# Patient Record
Sex: Male | Born: 1995 | Race: White | Hispanic: Yes | Marital: Married | Smoking: Current some day smoker
Health system: Southern US, Community
[De-identification: ages and names within clinical notes are randomized; demographics above are authoritative.]

---

## 2014-05-15 ENCOUNTER — Other Ambulatory Visit: Payer: Self-pay | Admitting: Occupational Medicine

## 2014-05-15 ENCOUNTER — Ambulatory Visit
Admission: RE | Admit: 2014-05-15 | Discharge: 2014-05-15 | Disposition: A | Discharge status: Home / Self Care | Payer: No Typology Code available for payment source | Source: Ambulatory Visit | Attending: Occupational Medicine | Admitting: Occupational Medicine

## 2014-05-15 DIAGNOSIS — Z021 Encounter for pre-employment examination: Secondary | ICD-10-CM

## 2015-12-27 IMAGING — CR DG CHEST 1V
1 series · 1 of 1 positions shown · non-contrast
Comparison: None.

CLINICAL DATA: Pre-employment physical exam

EXAM:
CHEST - 1 VIEW

[view not recorded]
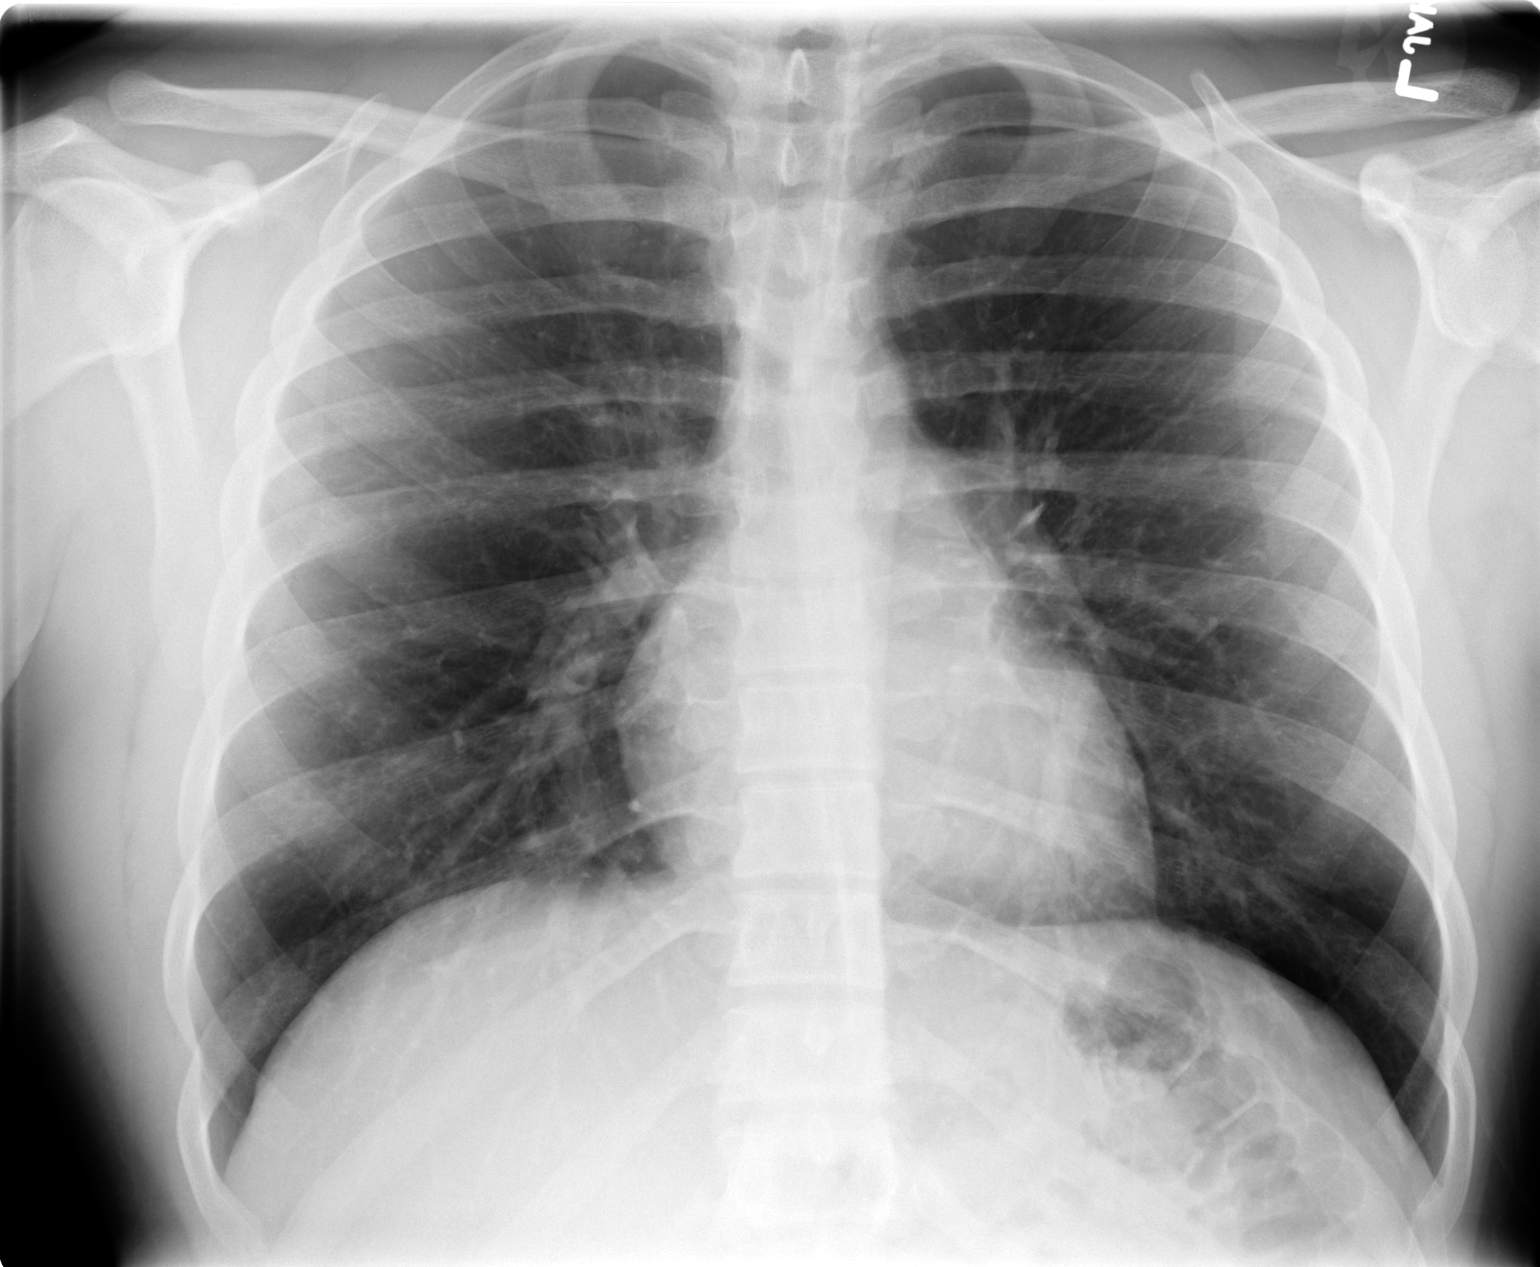

[1 of 1 positions shown; findings below may reference images not displayed]

FINDINGS: No active infiltrate or effusion is seen. Mediastinal and hilar
contours are unremarkable. The heart is within normal limits in
size. No bony abnormality is seen.
IMPRESSION: No active disease.

## 2016-10-17 ENCOUNTER — Encounter (HOSPITAL_BASED_OUTPATIENT_CLINIC_OR_DEPARTMENT_OTHER): Payer: Self-pay | Admitting: Emergency Medicine

## 2016-10-17 ENCOUNTER — Emergency Department (HOSPITAL_BASED_OUTPATIENT_CLINIC_OR_DEPARTMENT_OTHER)
Admission: EM | Admit: 2016-10-17 | Discharge: 2016-10-17 | Disposition: A | Discharge status: Home / Self Care | Payer: 59 | Attending: Emergency Medicine | Admitting: Emergency Medicine

## 2016-10-17 DIAGNOSIS — J029 Acute pharyngitis, unspecified: Secondary | ICD-10-CM | POA: Insufficient documentation

## 2016-10-17 LAB — RAPID STREP SCREEN (MED CTR MEBANE ONLY): STREPTOCOCCUS, GROUP A SCREEN (DIRECT): NEGATIVE

## 2016-10-17 MED ORDER — AMOXICILLIN 500 MG PO CAPS: 1000.0000 mg | ORAL_CAPSULE | Freq: Every day | ORAL | 0 refills | Status: AC

## 2016-10-17 MED ORDER — MAGIC MOUTHWASH W/LIDOCAINE: 5.0000 mL | Freq: Four times a day (QID) | ORAL | 0 refills | Status: DC | PRN

## 2016-10-17 NOTE — ED Provider Notes (Signed)
[facility]   CSN: [id number] Arrival date & time: 10/17/16  1901  By signing my name below, I, Mitchell Boyer, attest that this documentation has been prepared under the direction and in the presence of Beverley Sherrard PA-C. Electronically Signed: Thelma Boyer, Scribe. 10/17/16. 7:50 PM.  History   Chief Complaint Chief Complaint  Patient presents with  . Sore Throat   The history is provided by the patient. No language interpreter was used.   HPI Comments: Mitchell Boyer is a 21 y.o. male who presents to the Emergency Department complaining of constant, increasingly worsening left-sided sore throat since 3 days. He has associated pain with swallowing, left-sided neck pain, dry cough, rhinorrhea, mild changes in appetite, and diarrhea.  Denies sick contacts. Reports feeling chills. He has taken Dayquil with some relief. He denies SOB, drooling, ear pain, CP, nausea, vomiting.  History reviewed. No pertinent past medical history.  There are no active problems to display for this patient.   History reviewed. No pertinent surgical history.     Home Medications    Prior to Admission medications   Medication Sig Start Date End Date Taking? Authorizing Provider  amoxicillin (AMOXIL) 500 MG capsule Take 2 capsules (1,000 mg total) by mouth daily. 10/17/16 10/27/16  Dietrich Pates, PA-C  magic mouthwash w/lidocaine SOLN Take 5 mLs by mouth 4 (four) times daily as needed for mouth pain. 10/17/16   Dietrich Pates, PA-C    Family History No family history on file.  Social History Social History  Substance Use Topics  . Smoking status: Never Smoker  . Smokeless tobacco: Never Used  . Alcohol use No     Allergies   Patient has no known allergies.   Review of Systems Review of Systems  Constitutional: Negative for appetite change (mild), chills and fever.  HENT: Positive for rhinorrhea and sore throat. Negative for drooling, ear pain, facial swelling and trouble  swallowing.   Respiratory: Positive for cough (dry). Negative for shortness of breath.   Cardiovascular: Negative for chest pain.  Gastrointestinal: Positive for diarrhea. Negative for abdominal pain, nausea and vomiting.  Musculoskeletal: Positive for neck pain (left-sided).  Neurological: Negative for facial asymmetry.     Physical Exam Updated Vital Signs BP 129/71 (BP Location: Right Arm)   Pulse 73   Temp 98.3 F (36.8 C) (Oral)   Resp 16   Ht 5\' 10"  (1.778 m)   Wt 95.3 kg (210 lb)   SpO2 100%   BMI 30.13 kg/m   Physical Exam  Constitutional: He appears well-developed and well-nourished. No distress.  HENT:  Head: Normocephalic and atraumatic.  Right Ear: Tympanic membrane normal.  Left Ear: Tympanic membrane normal.  Nose: Nose normal.  Mouth/Throat: Uvula is midline and mucous membranes are normal. Posterior oropharyngeal erythema present. No tonsillar abscesses. Tonsils are 2+ on the left. Tonsillar exudate.  Eyes: Conjunctivae and EOM are normal. No scleral icterus.  Neck: Normal range of motion.  Pulmonary/Chest: Effort normal. No respiratory distress.  Lymphadenopathy:    He has cervical adenopathy (left-sided).  Neurological: He is alert.  Skin: No rash noted. He is not diaphoretic.  Psychiatric: He has a normal mood and affect.  Nursing note and vitals reviewed.    ED Treatments / Results  DIAGNOSTIC STUDIES: Oxygen Saturation is 100% on RA, normal by my interpretation.    COORDINATION OF CARE: 7:46 PM Discussed treatment plan with pt at bedside and pt agreed to plan.  Labs (all labs ordered are listed,  but only abnormal results are displayed) Labs Reviewed  RAPID STREP SCREEN (NOT AT [facility])  CULTURE, GROUP A STREP [facility])    EKG  EKG Interpretation None       Radiology No results found.  Procedures Procedures (including critical care time)  Medications Ordered in ED Medications - No data to display   Initial Impression / Assessment  and Plan / ED Course  I have reviewed the triage vital signs and the nursing notes.  Pertinent labs & imaging results that were available during my care of the patient were reviewed by me and considered in my medical decision making (see chart for details).     Patient's history and symptoms concerning for strep pharyngitis. Strep test returned as normal and was sent for culture. Patient is not in respiratory distress and is tolerating secretions. Neck is supple with full range of motion. No concern for RPA or PTA at this time. Due to tonsillar exudates and history of subjective fever, will treat with amoxicillin as directed. Will give patient Magic mouthwash to help with symptoms. Advised to continue Tylenol or ibuprofen as needed for pain and/or fevers. Patient appears stable for discharge at this time. No other complaints. Return precautions given.  Final Clinical Impressions(s) / ED Diagnoses   Final diagnoses:  Pharyngitis, unspecified etiology    New Prescriptions New Prescriptions   AMOXICILLIN (AMOXIL) 500 MG CAPSULE    Take 2 capsules (1,000 mg total) by mouth daily.   MAGIC MOUTHWASH W/LIDOCAINE SOLN    Take 5 mLs by mouth 4 (four) times daily as needed for mouth pain.   I personally performed the services described in this documentation, which was scribed in my presence. The recorded information has been reviewed and is accurate.     Dietrich PatesKhatri, Allegra Cerniglia, PA-C 10/17/16 Camillia Herter1955    Plunkett, Whitney, MD 10/17/16 [phone]

## 2016-10-17 NOTE — Discharge Instructions (Signed)
Take amoxicillin once daily for 10 days. Use Magic mouthwash as directed. Continue ibuprofen or Tylenol as needed for pain and inflammation. Return to ED for worsening pain, trouble breathing, trouble swallowing, high fevers, neck pain or decreased range of motion.

## 2016-10-17 NOTE — ED Triage Notes (Signed)
Sore throat x 3 days

## 2016-10-20 LAB — CULTURE, GROUP A STREP (THRC)

## 2018-12-14 ENCOUNTER — Other Ambulatory Visit: Payer: Self-pay

## 2018-12-14 DIAGNOSIS — Z20822 Contact with and (suspected) exposure to covid-19: Secondary | ICD-10-CM

## 2018-12-17 LAB — NOVEL CORONAVIRUS, NAA: SARS-CoV-2, NAA: NOT DETECTED

## 2023-02-26 ENCOUNTER — Other Ambulatory Visit: Payer: Self-pay

## 2023-02-26 ENCOUNTER — Emergency Department (HOSPITAL_BASED_OUTPATIENT_CLINIC_OR_DEPARTMENT_OTHER): Payer: 59 | Admitting: Radiology

## 2023-02-26 ENCOUNTER — Emergency Department (HOSPITAL_BASED_OUTPATIENT_CLINIC_OR_DEPARTMENT_OTHER)
Admission: EM | Admit: 2023-02-26 | Discharge: 2023-02-26 | Disposition: A | Discharge status: Home / Self Care | Payer: 59 | Attending: Emergency Medicine | Admitting: Emergency Medicine

## 2023-02-26 ENCOUNTER — Encounter (HOSPITAL_BASED_OUTPATIENT_CLINIC_OR_DEPARTMENT_OTHER): Payer: Self-pay | Admitting: Emergency Medicine

## 2023-02-26 DIAGNOSIS — J189 Pneumonia, unspecified organism: Secondary | ICD-10-CM | POA: Insufficient documentation

## 2023-02-26 DIAGNOSIS — R079 Chest pain, unspecified: Secondary | ICD-10-CM | POA: Diagnosis present

## 2023-02-26 LAB — CBC
HCT: 44.6 % (ref 39.0–52.0)
Hemoglobin: 15.7 g/dL (ref 13.0–17.0)
MCH: 31.8 pg (ref 26.0–34.0)
MCHC: 35.2 g/dL (ref 30.0–36.0)
MCV: 90.3 fL (ref 80.0–100.0)
Platelets: 270 10*3/uL (ref 150–400)
RBC: 4.94 MIL/uL (ref 4.22–5.81)
RDW: 11.9 % (ref 11.5–15.5)
WBC: 8.4 10*3/uL (ref 4.0–10.5)
nRBC: 0 % (ref 0.0–0.2)

## 2023-02-26 LAB — BASIC METABOLIC PANEL
Anion gap: 6 (ref 5–15)
BUN: 11 mg/dL (ref 6–20)
CO2: 28 mmol/L (ref 22–32)
Calcium: 9.7 mg/dL (ref 8.9–10.3)
Chloride: 104 mmol/L (ref 98–111)
Creatinine, Ser: 0.94 mg/dL (ref 0.61–1.24)
GFR, Estimated: >60 mL/min (ref >60)
Glucose, Bld: 81 mg/dL (ref 70–99)
Potassium: 4.1 mmol/L (ref 3.5–5.1)
Sodium: 138 mmol/L (ref 135–145)

## 2023-02-26 LAB — TROPONIN I (HIGH SENSITIVITY): Troponin I (High Sensitivity): 4 ng/L (ref <18)

## 2023-02-26 MED ORDER — PROMETHAZINE-DM 6.25-15 MG/5ML PO SYRP: 5.0000 mL | ORAL_SOLUTION | Freq: Four times a day (QID) | ORAL | 0 refills | Status: DC | PRN

## 2023-02-26 MED ORDER — AZITHROMYCIN 250 MG PO TABS: ORAL_TABLET | ORAL | 0 refills | Status: DC

## 2023-02-26 NOTE — ED Provider Notes (Signed)
[place] EMERGENCY DEPARTMENT AT [facility] Provider Note   CSN: [id number] Arrival date & time: 02/26/23  4098     History  Chief Complaint  Patient presents with   Chest Pain    Mitchell Boyer is a 27 y.o. male.  Patient with noncontributory past medical history presents today with complaints of.  He states that 3 weeks ago he was sick with a upper respiratory infection which lasted a few days and then resolved.  However, since then he has had a persistent cough that has been productive of yellow sputum.  Over the last few days he has started to have pain with his cough.  Pain is in his chest and back.  Denies any shortness of breath.  No fevers or chills.  No leg pain or leg swelling, recent travel or recent surgeries.  No history of similar symptoms previously.  He smokes an occasional cigar, however states last time was several months ago.  No regular smoking or recreational drug use.  The history is provided by the patient. No language interpreter was used.  Chest Pain Associated symptoms: cough        Home Medications Prior to Admission medications   Medication Sig Start Date End Date Taking? Authorizing Provider  magic mouthwash w/lidocaine SOLN Take 5 mLs by mouth 4 (four) times daily as needed for mouth pain. 10/17/16   Dietrich Pates, PA-C      Allergies    Patient has no known allergies.    Review of Systems   Review of Systems  Respiratory:  Positive for cough.   Cardiovascular:  Positive for chest pain.  All other systems reviewed and are negative.   Physical Exam Updated Vital Signs BP 138/84 (BP Location: Right Arm)   Pulse 78   Temp 97.8 F (36.6 C) (Oral)   Resp 18   Ht 5\' 10"  (1.778 m)   Wt 117.9 kg   SpO2 98%   BMI 37.31 kg/m  Physical Exam Vitals and nursing note reviewed.  Constitutional:      General: He is not in acute distress.    Appearance: Normal appearance. He is normal weight. He is not ill-appearing, toxic-appearing or  diaphoretic.  HENT:     Head: Normocephalic and atraumatic.  Neck:     Vascular: No JVD.  Cardiovascular:     Rate and Rhythm: Normal rate and regular rhythm.     Pulses:          Radial pulses are 2+ on the right side and 2+ on the left side.       Dorsalis pedis pulses are 2+ on the right side and 2+ on the left side.       Posterior tibial pulses are 2+ on the right side and 2+ on the left side.     Heart sounds: Normal heart sounds.  Pulmonary:     Effort: Pulmonary effort is normal. No respiratory distress.     Breath sounds: Normal breath sounds.  Chest:     Chest wall: No tenderness.  Abdominal:     Palpations: Abdomen is soft.     Tenderness: There is no abdominal tenderness.  Musculoskeletal:        General: Normal range of motion.     Cervical back: Normal range of motion and neck supple.     Right lower leg: No tenderness. No edema.     Left lower leg: No tenderness. No edema.  Skin:    General: Skin is  warm and dry.  Neurological:     General: No focal deficit present.     Mental Status: He is alert.  Psychiatric:        Mood and Affect: Mood normal.        Behavior: Behavior normal.     ED Results / Procedures / Treatments   Labs (all labs ordered are listed, but only abnormal results are displayed) Labs Reviewed  BASIC METABOLIC PANEL  CBC  TROPONIN I (HIGH SENSITIVITY)    EKG EKG Interpretation Date/Time:  Friday February 26 2023 09:41:57 EDT Ventricular Rate:  67 PR Interval:  140 QRS Duration:  90 QT Interval:  368 QTC Calculation: 389 R Axis:   51  Text Interpretation: Sinus rhythm Confirmed by Linwood Dibbles [phone]) on 02/26/2023 9:43:06 AM  Radiology DG Chest 2 View  Result Date: 02/26/2023 CLINICAL DATA:  Chest pain.  Cough for 3 weeks. EXAM: CHEST - 2 VIEW COMPARISON:  05/15/2014 FINDINGS: The heart is normal in size. Vague interstitial opacities in the right perihilar region and left lung base. No pulmonary edema, pleural effusion or  pneumothorax. No acute osseous findings. IMPRESSION: Vague interstitial opacities in the right perihilar region and left lung base, suspicious for atypical infection. Electronically Signed   By: Narda Rutherford M.D.   On: 02/26/2023 10:49    Procedures Procedures    Medications Ordered in ED Medications - No data to display  ED Course/ Medical Decision Making/ A&P                                 Medical Decision Making Amount and/or Complexity of Data Reviewed Labs: ordered. Radiology: ordered.   This patient is a 27 y.o. male who presents to the ED for concern of cough, chest pain, this involves an extensive number of treatment options, and is a complaint that carries with it a high risk of complications and morbidity. The emergent differential diagnosis prior to evaluation includes, but is not limited to,  ACS, pericarditis, myocarditis, aortic dissection, PE, pneumothorax, esophageal spasm or rupture, chronic angina, pneumonia, bronchitis, GERD, reflux/PUD, costochondritis, anxiety  This is not an exhaustive differential.   Past Medical History / Co-morbidities / Social History:  has no past medical history on file.  Additional history: Chart reviewed.  Physical Exam: Physical exam performed. The pertinent findings include: well appearing, no physical exam abnormalities  Lab Tests: I ordered, and personally interpreted labs.  The pertinent results include: Troponin negative, no acute laboratory abnormalities.   Imaging Studies: I ordered imaging studies including CXR. I independently visualized and interpreted imaging which showed   Vague interstitial opacities in the right perihilar region and left lung base, suspicious for atypical infection.   I agree with the radiologist interpretation.   Cardiac Monitoring:  The patient was maintained on a cardiac monitor.  My attending physician Dr. Lynelle Doctor viewed and interpreted the cardiac monitored which showed an underlying  rhythm of: sinus rhythm, no STEMI. I agree with this interpretation.   Disposition: After consideration of the diagnostic results and the patients response to treatment, I feel that emergency department workup does not suggest an emergent condition requiring admission or immediate intervention beyond what has been performed at this time. The plan is: discharge with azithromycin for atypical pneumonia.  Patient afebrile, nontoxic-appearing, and in no acute distress reassuring vital signs.  His heart score is 0 and he is PERC negative.  Suspect his symptoms are  due to the atypical pneumonia seen on chest x-ray.  No COVID swab ordered given symptoms have been ongoing for 3 weeks.  Will also send for promethazine cough syrup for cough suppression.  Patient advised not to drive or operate heavy machinery while taking this medication.  Evaluation and diagnostic testing in the emergency department does not suggest an emergent condition requiring admission or immediate intervention beyond what has been performed at this time.  Plan for discharge with close PCP follow-up.  Patient is understanding and amenable with plan, educated on red flag symptoms that would prompt immediate return.  Patient discharged in stable condition.  Final Clinical Impression(s) / ED Diagnoses Final diagnoses:  Atypical pneumonia    Rx / DC Orders ED Discharge Orders          Ordered    azithromycin (ZITHROMAX Z-PAK) 250 MG tablet        02/26/23 1239    promethazine-dextromethorphan (PROMETHAZINE-DM) 6.25-15 MG/5ML syrup  4 times daily PRN        02/26/23 1239          An After Visit Summary was printed and given to the patient.     Vear Clock 02/26/23 1241    Linwood Dibbles, MD 03/01/23 [phone]

## 2023-02-26 NOTE — ED Triage Notes (Signed)
Pt c/o cough x 3 weeks, reports LT side CP and back pain, worse with deep inspiration starting yesterday. Ibuprofen at 0230 with "a little" relief

## 2023-02-26 NOTE — ED Notes (Signed)
Mode of transportation changed upon approval from Dallas Schimke, RN that pt can come to x-ray in [facility] instead of stretcher.

## 2023-02-26 NOTE — Discharge Instructions (Signed)
As we discussed, your workup in the ER today revealed that you do have what appears to be walking pneumonia on your chest x-ray.  For this, I have written you a prescription for azithromycin for you to take as prescribed in its entirety for management of this.  I have also given you a prescription for promethazine cough syrup which is a cough suppressant medication for you to take as prescribed as needed.  Do not drive or operate heavy machinery while taking this medication as it can be sedating.  Follow-up with your primary doctor in the next 1 week for a repeat chest x-ray to ensure that this has resolved.  Return if development of any new or worsening symptoms.

## 2023-08-13 ENCOUNTER — Ambulatory Visit (HOSPITAL_BASED_OUTPATIENT_CLINIC_OR_DEPARTMENT_OTHER): Admitting: Student

## 2023-08-13 ENCOUNTER — Encounter (HOSPITAL_BASED_OUTPATIENT_CLINIC_OR_DEPARTMENT_OTHER): Payer: Self-pay | Admitting: Student

## 2023-08-13 VITALS — BP 131/91 | HR 75 | Temp 98.1°F | Ht 70.87 in | Wt 265.9 lb

## 2023-08-13 DIAGNOSIS — J302 Other seasonal allergic rhinitis: Secondary | ICD-10-CM | POA: Insufficient documentation

## 2023-08-13 DIAGNOSIS — I10 Essential (primary) hypertension: Secondary | ICD-10-CM | POA: Diagnosis not present

## 2023-08-13 DIAGNOSIS — R42 Dizziness and giddiness: Secondary | ICD-10-CM | POA: Insufficient documentation

## 2023-08-13 DIAGNOSIS — Z7689 Persons encountering health services in other specified circumstances: Secondary | ICD-10-CM

## 2023-08-13 MED ORDER — LOSARTAN POTASSIUM 25 MG PO TABS: 25.0000 mg | ORAL_TABLET | Freq: Every day | ORAL | 5 refills | Status: AC

## 2023-08-13 MED ORDER — MECLIZINE HCL 50 MG PO TABS: 50.0000 mg | ORAL_TABLET | Freq: Three times a day (TID) | ORAL | 0 refills | Status: AC | PRN

## 2023-08-13 NOTE — Patient Instructions (Signed)
 It was nice to see you today!  As we discussed in clinic if the dizziness recurs please contact me. Please make sure to drink plenty of water. If you develop any other symptoms or headaches please let me know.   If you develop any slurring of speech or loss of normal movement- please head to the ER.  If you have any problems before your next visit feel free to message me via MyChart (minor issues or questions) or call the office, otherwise you may reach out to schedule an office visit.  Thank you! Gerilyn Pilgrim Shelsea Hangartner, PA-C

## 2023-08-13 NOTE — Assessment & Plan Note (Addendum)
-  Top differential currently is migrainous episode with vertiginous aura and/or dehydration. -Examination totally normal today- not concerning for any acute pathologies. Normal Dix-Hallpike and HINTS exam normal.  Do not suspect meniere's at this time, no noted tinnitus or hearing loss. Posterior stroke unlikely with negative hints exam and age of 80. No FMH of stroke or hypercoagulable conditions noted. -Order meclizine 25mg  for as needed use- will provide diagnostic information if this alleviates symptoms.

## 2023-08-13 NOTE — Assessment & Plan Note (Addendum)
 Stable continue current regimen.

## 2023-08-13 NOTE — Assessment & Plan Note (Addendum)
-  Order losartan 25mg  daily -Check potassium at next visit -Keep BP log to assess

## 2023-08-13 NOTE — Progress Notes (Signed)
 New Patient Office Visit  Subjective    Patient ID: Mitchell Boyer, male    DOB: 15-May-1996  Age: 28 y.o. MRN: [id number]  CC:  Chief Complaint  Patient presents with   Establish Care    Pt. Here to establish care.    feeling dizzy    Pt. Felt dizzy on 08/09/23 all day and part of the morning on 08/10/23 with some nausea. Pt. Denies dizziness at this time. Pt had concerns of hypertension that was due to his weight.     HPI Mitchell Boyer presents to establish care. Prior PCP was in [place] but was lost to followup. Last physical was a long time ago.  Dizziness- Patient notes that he was dizzy late in the afternoon on 08/09/23 as he was laying on the ground playing with his children. As he got up he got dizzy and then that developed into nausea. The next morning he ws still slightly dizzy and nauseated. Notes that he developed a headache the night before. States he may have been dizzy for 2 hours before developing a headache. If he stood up everything was turning left. Notes photosensitivity and phonosensitivity with headache. Has not been sick recently. Does not believe that migraines run in his family. Notes that the headache in the back of his head was characterized as throbbing. Patient notes that he has had concerns due to the hypertension.  Seasonal Allergies- Stable on zyrtec and flonase- working well. Notes that allergies are bad in both spring and fall.   Hypertension-  BP today 137/83 with pulse of 75. BP at home has been around 140/90s.  No noted chest pain, SOB, dizziness, or heart palpitations.  Patient is agreeable to dietary intervention alongside low dose of losartan.  Reviewed prior ED note from 02/26/23.  Screenings:  Colon Cancer: not indicated Lung Cancer: not indicated Diabetes: indicated  HLD: indicated     Outpatient Encounter Medications as of 08/13/2023  Medication Sig   losartan (COZAAR) 25 MG tablet Take 1 tablet (25 mg total) by mouth daily.   meclizine  (ANTIVERT) 50 MG tablet Take 1 tablet (50 mg total) by mouth 3 (three) times daily as needed.   [DISCONTINUED] azithromycin (ZITHROMAX Z-PAK) 250 MG tablet Take two 250 mg tablets today followed by one 250 mg tablet for the following 5 days. (Patient not taking: Reported on 08/13/2023)   [DISCONTINUED] magic mouthwash w/lidocaine SOLN Take 5 mLs by mouth 4 (four) times daily as needed for mouth pain. (Patient not taking: Reported on 08/13/2023)   [DISCONTINUED] promethazine-dextromethorphan (PROMETHAZINE-DM) 6.25-15 MG/5ML syrup Take 5 mLs by mouth 4 (four) times daily as needed for cough. (Patient not taking: Reported on 08/13/2023)   No facility-administered encounter medications on file as of 08/13/2023.    History reviewed. No pertinent past medical history.  History reviewed. No pertinent surgical history.  Family History  Problem Relation Age of Onset   Hypertension Mother    Hyperlipidemia Mother     Social History   Socioeconomic History   Marital status: Married    Spouse name: Not on file   Number of children: Not on file   Years of education: Not on file   Highest education level: Not on file  Occupational History   Not on file  Tobacco Use   Smoking status: Some Days    Types: Cigars   Smokeless tobacco: Never  Vaping Use   Vaping status: Never Used  Substance and Sexual Activity   Alcohol use: Yes  Drug use: No   Sexual activity: Not on file  Other Topics Concern   Not on file  Social History Narrative   Not on file   Social Drivers of Health   Financial Resource Strain: Not on file  Food Insecurity: No Food Insecurity (09/27/2020)   [facility] from [facility]   Hunger Vital Sign    Worried About Running Out of Food in the Last Year: Never true    Ran Out of Food in the Last Year: Never true  Transportation Needs: Not on file  Physical Activity: Not on file  Stress: Not on file  Social Connections: Unknown (10/04/2021)   Received from [facility]    Social Network    Social Network: Not on file  Intimate Partner Violence: Unknown (08/27/2021)   Received from [facility]   HITS    Physically Hurt: Not on file    Insult or Talk Down To: Not on file    Threaten Physical Harm: Not on file    Scream or Curse: Not on file    ROS  Per HPI      Objective    BP (!) 131/91   Pulse 75   Temp 98.1 F (36.7 C) (Oral)   Ht 5' 10.87" (1.8 m)   Wt 265 lb 14.4 oz (120.6 kg)   SpO2 98%   BMI 37.23 kg/m   Physical Exam Constitutional:      General: He is not in acute distress.    Appearance: Normal appearance. He is not ill-appearing.  HENT:     Head: Normocephalic and atraumatic.     Right Ear: External ear normal.     Left Ear: External ear normal.     Nose: Nose normal.     Mouth/Throat:     Mouth: Mucous membranes are moist.     Pharynx: Oropharynx is clear.  Eyes:     General: No scleral icterus.    Extraocular Movements: Extraocular movements intact.     Conjunctiva/sclera: Conjunctivae normal.     Pupils: Pupils are equal, round, and reactive to light.  Neck:     Vascular: No carotid bruit.  Cardiovascular:     Rate and Rhythm: Normal rate and regular rhythm.     Pulses: Normal pulses.     Heart sounds: Normal heart sounds. No murmur heard.    No friction rub.  Pulmonary:     Effort: Pulmonary effort is normal. No respiratory distress.     Breath sounds: Normal breath sounds. No wheezing, rhonchi or rales.  Musculoskeletal:        General: Normal range of motion.     Cervical back: Neck supple.     Right lower leg: No edema.     Left lower leg: No edema.  Skin:    General: Skin is warm and dry.     Coloration: Skin is not jaundiced or pale.  Neurological:     General: No focal deficit present.     Mental Status: He is alert.     Gait: Gait normal.     Deep Tendon Reflexes: Reflexes normal.     Comments: HINTS Exam: normal. Dix Hallpike: Normal No neurological changes noted. No slurring of speech. No  drooping of face or inactivity of UE or LE.  Psychiatric:        Mood and Affect: Mood normal.        Behavior: Behavior normal.         Assessment & Plan:  Encounter to establish care  Primary hypertension Assessment & Plan: -Order losartan 25mg  daily -Check potassium at next visit -Keep BP log to assess  Orders: -     Losartan Potassium; Take 1 tablet (25 mg total) by mouth daily.  Dispense: 30 tablet; Refill: 5  Dizziness Assessment & Plan: -Top differential currently is migrainous episode with vertiginous aura and/or dehydration. -Examination totally normal today- not concerning for any acute pathologies. Normal Dix-Hallpike and HINTS exam normal.  Do not suspect meniere's at this time, no noted tinnitus or hearing loss. Posterior stroke unlikely with negative hints exam and age of 10. No FMH of stroke or hypercoagulable conditions noted. -Order meclizine 25mg  for as needed use- will provide diagnostic information if this alleviates symptoms.   Orders: -     Meclizine HCl; Take 1 tablet (50 mg total) by mouth 3 (three) times daily as needed.  Dispense: 30 tablet; Refill: 0  Seasonal allergies Assessment & Plan: -Stable- continue current regimen.     Return in about 6 weeks (around 09/24/2023) for HTN, Annual Physical.   Teryl Lucy Manmeet Arzola, PA-C

## 2023-10-01 ENCOUNTER — Ambulatory Visit (HOSPITAL_BASED_OUTPATIENT_CLINIC_OR_DEPARTMENT_OTHER): Admitting: Student

## 2023-10-01 ENCOUNTER — Encounter (HOSPITAL_BASED_OUTPATIENT_CLINIC_OR_DEPARTMENT_OTHER): Payer: Self-pay | Admitting: Student

## 2023-10-01 VITALS — BP 122/78 | HR 73 | Temp 98.3°F | Resp 16 | Ht 71.26 in | Wt 267.4 lb

## 2023-10-01 DIAGNOSIS — Z13220 Encounter for screening for lipoid disorders: Secondary | ICD-10-CM

## 2023-10-01 DIAGNOSIS — Z23 Encounter for immunization: Secondary | ICD-10-CM

## 2023-10-01 DIAGNOSIS — I10 Essential (primary) hypertension: Secondary | ICD-10-CM

## 2023-10-01 DIAGNOSIS — Z0000 Encounter for general adult medical examination without abnormal findings: Secondary | ICD-10-CM

## 2023-10-01 DIAGNOSIS — Z1159 Encounter for screening for other viral diseases: Secondary | ICD-10-CM | POA: Diagnosis not present

## 2023-10-01 DIAGNOSIS — R5383 Other fatigue: Secondary | ICD-10-CM

## 2023-10-01 DIAGNOSIS — Z131 Encounter for screening for diabetes mellitus: Secondary | ICD-10-CM

## 2023-10-01 DIAGNOSIS — Z114 Encounter for screening for human immunodeficiency virus [HIV]: Secondary | ICD-10-CM

## 2023-10-01 DIAGNOSIS — Z136 Encounter for screening for cardiovascular disorders: Secondary | ICD-10-CM

## 2023-10-01 NOTE — Progress Notes (Signed)
 Complete physical exam  Patient: Mitchell Boyer   DOB: 1996/03/27   28 y.o. Male  MRN: [id number]  Subjective:     Chief Complaint  Patient presents with   Follow-up    Pt. Here for a follow-up visit for HTN and physical.    Mitchell Boyer is a 28 y.o. male who presents today for a complete physical exam. He reports consuming a general diet. Staying away from too many carbs- mainly eating potatoes. Patient notes that he is doing intermittent fasting. Gym/ health club routine includes cardio, light weights, and treadmill. He generally feels well. He reports sleeping well. He does have additional problems to discuss today.   Hypertension- Pt denies chest pain, SOB, dizziness, or heart palpitations.  Taking meds as directed w/o problems.  Denies medication side effects.    Right shoulder pain- labral tear years ago. He states that he gets some abnormal anterior shoulder pain since he has started working out again.  Testosterone concerns- He feels as though his mood is flat-lined. Feels as though he has had stable testicular size but they may be a bit smaller. Feels as though he may be fatigued most of the time. Does not feel like sex drive has gone down.  Most recent fall risk assessment:    10/01/2023    9:09 AM  Fall Risk   Falls in the past year? 0  Number falls in past yr: 0  Injury with Fall? 0  Risk for fall due to : No Fall Risks  Follow up Falls evaluation completed     Most recent depression screenings:    08/13/2023    9:20 AM  PHQ 2/9 Scores  PHQ - 2 Score 0  PHQ- 9 Score 3    Vision:Within last year and Dental: No current dental problems and No regular dental care   Patient Active Problem List   Diagnosis Date Noted   Other fatigue 10/01/2023   Primary hypertension 08/13/2023   Dizziness 08/13/2023   Seasonal allergies 08/13/2023   History reviewed. No pertinent past medical history. Allergies  Allergen Reactions   Shellfish Allergy Itching      Patient  Care Team: Oliviana Mcgahee T, PA-C as PCP - General (Physician Assistant)   Outpatient Medications Prior to Visit  Medication Sig   losartan  (COZAAR ) 25 MG tablet Take 1 tablet (25 mg total) by mouth daily.   meclizine  (ANTIVERT ) 50 MG tablet Take 1 tablet (50 mg total) by mouth 3 (three) times daily as needed. (Patient not taking: Reported on 10/01/2023)   No facility-administered medications prior to visit.    ROS  Per HPI      Objective:     BP 122/78   Pulse 73   Temp 98.3 F (36.8 C) (Oral)   Resp 16   Ht 5' 11.26" (1.81 m)   Wt 267 lb 6.4 oz (121.3 kg)   SpO2 97%   BMI 37.02 kg/m  BP Readings from Last 3 Encounters:  10/01/23 122/78  08/13/23 (!) 131/91  02/26/23 118/80   Wt Readings from Last 3 Encounters:  10/01/23 267 lb 6.4 oz (121.3 kg)  08/13/23 265 lb 14.4 oz (120.6 kg)  02/26/23 260 lb (117.9 kg)      Physical Exam Constitutional:      General: He is not in acute distress.    Appearance: Normal appearance. He is not ill-appearing or diaphoretic.  HENT:     Head: Normocephalic and atraumatic.     Right Ear: Tympanic  membrane, ear canal and external ear normal.     Left Ear: Tympanic membrane, ear canal and external ear normal.     Nose: Nose normal.     Mouth/Throat:     Mouth: Mucous membranes are moist.     Pharynx: Oropharynx is clear.  Eyes:     General: No scleral icterus.       Right eye: No discharge.        Left eye: No discharge.     Extraocular Movements: Extraocular movements intact.     Conjunctiva/sclera: Conjunctivae normal.     Pupils: Pupils are equal, round, and reactive to light.  Neck:     Thyroid: No thyroid mass, thyromegaly or thyroid tenderness.     Vascular: No carotid bruit.  Cardiovascular:     Rate and Rhythm: Normal rate and regular rhythm.     Pulses: Normal pulses.     Heart sounds: Normal heart sounds. No murmur heard.    No friction rub. No gallop.  Pulmonary:     Effort: Pulmonary effort is normal.      Breath sounds: Normal breath sounds. No wheezing, rhonchi or rales.  Chest:     Chest wall: No tenderness.  Abdominal:     General: Bowel sounds are normal. There is no distension.     Palpations: Abdomen is soft.     Tenderness: There is no abdominal tenderness. There is no guarding.     Hernia: There is no hernia in the left inguinal area or right inguinal area.  Genitourinary:    Penis: Normal and uncircumcised.      Testes: Normal.        Right: Mass or swelling not present. Right testis is descended.        Left: Mass or swelling not present. Left testis is descended.     Epididymis:     Right: Normal.     Left: Normal.  Musculoskeletal:        General: No swelling, deformity or signs of injury.     Cervical back: Neck supple.     Right lower leg: No edema.     Left lower leg: No edema.     Comments: Shoulder Exam:  Palpation revealed no pain at the [facility] joint or proximal biceps tendon. With hand behind back patient has pain to no portion of the shoulder Full ROM- both active and passive Active external rotation of shoulder (patients arms at their side- winging out hands) revealed no limitation in movement  No pain with cross body adduction of affected shoulder (AC joint).  Rotator Cuff pathologies: Drop arm: Neg Painful arc: Neg   Impingement: Hawkins Test: Neg  Neer's Test: Neg    Lymphadenopathy:     Cervical: No cervical adenopathy.     Right cervical: No superficial or posterior cervical adenopathy.    Left cervical: No superficial cervical adenopathy.  Skin:    Coloration: Skin is not jaundiced.     Findings: No rash.  Neurological:     General: No focal deficit present.     Mental Status: He is alert and oriented to person, place, and time.     Motor: No weakness.  Psychiatric:        Behavior: Behavior normal.      No results found for any visits on 10/01/23. Last CBC Lab Results  Component Value Date   WBC 8.4 02/26/2023   HGB 15.7 02/26/2023    HCT 44.6 02/26/2023   MCV 90.3  02/26/2023   MCH 31.8 02/26/2023   RDW 11.9 02/26/2023   PLT 270 02/26/2023   Last metabolic panel Lab Results  Component Value Date   GLUCOSE 81 02/26/2023   NA 138 02/26/2023   K 4.1 02/26/2023   CL 104 02/26/2023   CO2 28 02/26/2023   BUN 11 02/26/2023   CREATININE 0.94 02/26/2023   GFRNONAA >60 02/26/2023   CALCIUM 9.7 02/26/2023   ANIONGAP 6 02/26/2023   Last lipids No results found for: "CHOL", "HDL", "LDLCALC", "LDLDIRECT", "TRIG", "CHOLHDL" Last vitamin B12 and Folate No results found for: "VITAMINB12", "FOLATE"      Assessment & Plan:    Routine Health Maintenance and Physical Exam  Health Maintenance  Topic Date Due   Hepatitis C Screening  Never done   Pneumococcal Vaccination (1 of 2 - PCV) 10/13/2023*   HIV Screening  10/13/2023*   COVID-19 Vaccine (1 - 2024-25 season) 02/23/2024*   Flu Shot  12/24/2023   DTaP/Tdap/Td vaccine (3 - Td or Tdap) 09/30/2033   HPV Vaccine  Aged Out   Meningitis B Vaccine  Aged Out  *Topic was postponed. The date shown is not the original due date.    Discussed health benefits of physical activity, and encouraged him to engage in regular exercise appropriate for his age and condition. Discussed recommendations regarding diet, exercise, sleep, and vaccinations- answered all questions.  Adult general medical exam -     HCV RNA quant rflx ultra or genotyp; Future -     HIV Antibody (routine testing w rflx); Future -     CBC with Differential/Platelet; Future -     Comprehensive metabolic panel with GFR; Future -     Lipid panel; Future -     Hemoglobin A1c; Future  Primary hypertension Assessment & Plan: Stable- continue current regimen.  Assess CBC and CMP.  Orders: -     CBC with Differential/Platelet; Future -     Comprehensive metabolic panel with GFR; Future  Need for diphtheria-tetanus-pertussis (Tdap) vaccine -     Tdap vaccine greater than or equal to 7yo IM  Need for  hepatitis C screening test -     HCV RNA quant rflx ultra or genotyp; Future  Screening for HIV (human immunodeficiency virus) -     HIV Antibody (routine testing w rflx); Future  Other fatigue Assessment & Plan: Patient feels as though he is having trouble losing weight and is fatigued most of the time. He would like his T level checked to assess for def. Discussed that this would need to be repeated if low and should be taken fasting between 8-10am.  Orders: -     Testosterone; Future  Encounter for lipid screening for cardiovascular disease -     Lipid panel; Future  Screening for diabetes mellitus -     Hemoglobin A1c; Future    Return in about 1 year (around 09/30/2024) for Annual Physical.     Preethi Scantlebury T Iracema Lanagan, PA-C

## 2023-10-01 NOTE — Assessment & Plan Note (Signed)
 Patient feels as though he is having trouble losing weight and is fatigued most of the time. He would like his T level checked to assess for def. Discussed that this would need to be repeated if low and should be taken fasting between 8-10am.

## 2023-10-01 NOTE — Assessment & Plan Note (Signed)
 Stable- continue current regimen.  Assess CBC and CMP.

## 2023-10-01 NOTE — Patient Instructions (Addendum)
 It was nice to see you today!  As we discussed in clinic:  I will let you know bout lab results.  If you have any problems before your next visit feel free to message me via MyChart (minor issues or questions) or call the office, otherwise you may reach out to schedule an office visit.  Thank you! Ousman Dise, PA-C

## 2023-10-22 ENCOUNTER — Other Ambulatory Visit (HOSPITAL_BASED_OUTPATIENT_CLINIC_OR_DEPARTMENT_OTHER)

## 2023-10-22 ENCOUNTER — Other Ambulatory Visit (HOSPITAL_BASED_OUTPATIENT_CLINIC_OR_DEPARTMENT_OTHER): Payer: Self-pay

## 2023-10-22 DIAGNOSIS — Z114 Encounter for screening for human immunodeficiency virus [HIV]: Secondary | ICD-10-CM

## 2023-10-22 DIAGNOSIS — Z0000 Encounter for general adult medical examination without abnormal findings: Secondary | ICD-10-CM

## 2023-10-22 DIAGNOSIS — I10 Essential (primary) hypertension: Secondary | ICD-10-CM

## 2023-10-22 DIAGNOSIS — Z13220 Encounter for screening for lipoid disorders: Secondary | ICD-10-CM

## 2023-10-22 DIAGNOSIS — Z1159 Encounter for screening for other viral diseases: Secondary | ICD-10-CM

## 2023-10-22 DIAGNOSIS — R5383 Other fatigue: Secondary | ICD-10-CM

## 2023-10-22 DIAGNOSIS — Z131 Encounter for screening for diabetes mellitus: Secondary | ICD-10-CM

## 2023-10-23 LAB — CBC WITH DIFFERENTIAL/PLATELET
Basophils Absolute: 0 10*3/uL (ref 0.0–0.2)
Basos: 1 %
EOS (ABSOLUTE): 0.2 10*3/uL (ref 0.0–0.4)
Eos: 3 %
Hematocrit: 45.1 % (ref 37.5–51.0)
Hemoglobin: 15.2 g/dL (ref 13.0–17.7)
Immature Grans (Abs): 0 10*3/uL (ref 0.0–0.1)
Immature Granulocytes: 0 %
Lymphocytes Absolute: 2.4 10*3/uL (ref 0.7–3.1)
Lymphs: 41 %
MCH: 31.9 pg (ref 26.6–33.0)
MCHC: 33.7 g/dL (ref 31.5–35.7)
MCV: 95 fL (ref 79–97)
Monocytes Absolute: 0.4 10*3/uL (ref 0.1–0.9)
Monocytes: 6 %
Neutrophils Absolute: 2.9 10*3/uL (ref 1.4–7.0)
Neutrophils: 49 %
Platelets: 267 10*3/uL (ref 150–450)
RBC: 4.77 x10E6/uL (ref 4.14–5.80)
RDW: 12.7 % (ref 11.6–15.4)
WBC: 5.9 10*3/uL (ref 3.4–10.8)

## 2023-10-23 LAB — HCV RNA QUANT RFLX ULTRA OR GENOTYP: HCV Quant Baseline: NOT DETECTED [IU]/mL

## 2023-10-23 LAB — COMPREHENSIVE METABOLIC PANEL WITH GFR
ALT: 65 IU/L — ABNORMAL HIGH (ref 0–44)
AST: 39 IU/L (ref 0–40)
Albumin: 4.5 g/dL (ref 4.3–5.2)
Alkaline Phosphatase: 70 IU/L (ref 44–121)
BUN/Creatinine Ratio: 17 (ref 9–20)
BUN: 13 mg/dL (ref 6–20)
Bilirubin Total: 0.3 mg/dL (ref 0.0–1.2)
CO2: 20 mmol/L (ref 20–29)
Calcium: 9.4 mg/dL (ref 8.7–10.2)
Chloride: 103 mmol/L (ref 96–106)
Creatinine, Ser: 0.76 mg/dL (ref 0.76–1.27)
Globulin, Total: 2.9 g/dL (ref 1.5–4.5)
Glucose: 96 mg/dL (ref 70–99)
Potassium: 4.3 mmol/L (ref 3.5–5.2)
Sodium: 140 mmol/L (ref 134–144)
Total Protein: 7.4 g/dL (ref 6.0–8.5)
eGFR: 124 mL/min/{1.73_m2} (ref >59)

## 2023-10-23 LAB — HEMOGLOBIN A1C
Est. average glucose Bld gHb Est-mCnc: 105 mg/dL
Hgb A1c MFr Bld: 5.3 % (ref 4.8–5.6)

## 2023-10-23 LAB — HIV ANTIBODY (ROUTINE TESTING W REFLEX): HIV Screen 4th Generation wRfx: NONREACTIVE

## 2023-10-23 LAB — LIPID PANEL
Chol/HDL Ratio: 5.6 ratio — ABNORMAL HIGH (ref 0.0–5.0)
Cholesterol, Total: 178 mg/dL (ref 100–199)
HDL: 32 mg/dL — ABNORMAL LOW (ref >39)
LDL Chol Calc (NIH): 130 mg/dL — ABNORMAL HIGH (ref 0–99)
Triglycerides: 86 mg/dL (ref 0–149)
VLDL Cholesterol Cal: 16 mg/dL (ref 5–40)

## 2023-10-23 LAB — TESTOSTERONE: Testosterone: 568 ng/dL (ref 264–916)

## 2023-10-25 ENCOUNTER — Ambulatory Visit (HOSPITAL_BASED_OUTPATIENT_CLINIC_OR_DEPARTMENT_OTHER): Payer: Self-pay | Admitting: Student

## 2023-10-25 DIAGNOSIS — R7401 Elevation of levels of liver transaminase levels: Secondary | ICD-10-CM

## 2024-04-11 ENCOUNTER — Ambulatory Visit (HOSPITAL_BASED_OUTPATIENT_CLINIC_OR_DEPARTMENT_OTHER)

## 2024-04-11 ENCOUNTER — Ambulatory Visit (HOSPITAL_BASED_OUTPATIENT_CLINIC_OR_DEPARTMENT_OTHER): Admitting: Student

## 2024-04-11 DIAGNOSIS — G8929 Other chronic pain: Secondary | ICD-10-CM | POA: Diagnosis not present

## 2024-04-11 DIAGNOSIS — M25511 Pain in right shoulder: Secondary | ICD-10-CM

## 2024-04-11 MED ORDER — LIDOCAINE HCL 1 % IJ SOLN
4.0000 mL | INTRAMUSCULAR | Status: AC | PRN
  Administered 2024-04-11: 4 mL

## 2024-04-11 MED ORDER — TRIAMCINOLONE ACETONIDE 40 MG/ML IJ SUSP
2.0000 mL | INTRAMUSCULAR | Status: AC | PRN
  Administered 2024-04-11: 2 mL via INTRA_ARTICULAR

## 2024-04-11 NOTE — Progress Notes (Signed)
 Chief Complaint: Right shoulder pain    Discussed the use of AI scribe software for clinical note transcription with the patient, who gave verbal consent to proceed.  History of Present Illness Mitchell Boyer is a 28 year old male who presents with right shoulder pain.  He reports history of a labrum tear in his right shoulder due to an injury while playing baseball in high school approximately 15 years ago.  This was confirmed via MRI and he underwent physical therapy at that time, and still continues to perform some of these exercises and stretches. He experiences a sensation of the shoulder 'going to fall out' when sleeping on the right side, with pain upon waking. The pain is deep within the shoulder, radiating to the chest, causing tightness. A 'clunking' sensation occurs with shoulder movement. Physical activities such as push-ups and playing pickleball and baseball with his children exacerbate the shoulder issues. The pain is more noticeable when sleeping on the right side and when sitting with poor posture at his desk. The shoulder pain impacts comfort and daily activities, sometimes causing chest discomfort described as tightness at the top of the chest. 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   Surgical History:   None  PMH/PSH/Family History/Social History/Meds/Allergies:   No past medical history on file. No past surgical history on file. Social History   Socioeconomic History   Marital status: Married    Spouse name: Not on file   Number of children: Not on file   Years of education: Not on file   Highest education level: Not on file  Occupational History   Not on file  Tobacco Use   Smoking status: Some Days    Types: Cigars   Smokeless tobacco: Never  Vaping Use   Vaping status: Never Used  Substance and Sexual Activity   Alcohol use: Yes   Drug use: No   Sexual activity:  Not on file  Other Topics Concern   Not on file  Social History Narrative   Not on file   Social Drivers of Health   Financial Resource Strain: Not on file  Food Insecurity: No Food Insecurity (09/27/2020)   Received from [facility]   Hunger Vital Sign    Within the past 12 months, you worried that your food would run out before you got the money to buy more.: Never true    Within the past 12 months, the food you bought just didn't last and you didn't have money to get more.: Never true  Transportation Needs: Not on file  Physical Activity: Not on file  Stress: Not on file  Social Connections: Not on file   Family History  Problem Relation Age of Onset   Hypertension Mother    Hyperlipidemia Mother    Allergies  Allergen Reactions   Shellfish Allergy Itching   Current Outpatient Medications  Medication Sig Dispense Refill   losartan  (COZAAR ) 25 MG tablet Take 1 tablet (25 mg total) by mouth daily. 30 tablet 5   meclizine  (ANTIVERT ) 50 MG tablet Take 1 tablet (50 mg total) by mouth 3 (three) times daily as needed. (Patient not taking: Reported on 10/01/2023) 30 tablet 0   No current facility-administered medications for this visit.   No results found.  Review of Systems:   A ROS was performed including pertinent positives and negatives as documented in the HPI.  Physical Exam :   Constitutional: NAD and appears stated age Neurological: Alert and oriented Psych: Appropriate affect and cooperative There were no vitals taken for this visit.   Comprehensive Musculoskeletal Exam:    Exam of the right shoulder demonstrates active range of motion to 160 degrees forward flexion, 80 degrees external rotation, and internal rotation to L5.  Tenderness over the anterior glenohumeral joint.  Positive Vonzell and O'Brien.  Pain without weakness with empty can.  Decreased internal range of motion with lift off test compared to contralateral side.  Imaging:   Xray (right shoulder 3  views): Negative for bony abnormality   I personally reviewed and interpreted the radiographs.      Assessment & Plan Right shoulder pain with history of labral tear   Patient continues to experience chronic right shoulder pain dating back to a high school baseball injury sustained approximately 15 years ago.  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  On exam today, he does have findings concerning for an underlying labrum issue although testing does also suggest some possible underlying rotator cuff tendinopathy.  I have recommended a subacromial injection for diagnostic purposes and symptom relief which patient is agreeable to.  Injection was performed without complication and he tolerated this well.  Will plan to proceed with an MRI arthrogram of the right shoulder in order to further investigate both the labrum and state of the rotator cuff.  Will plan to follow-up shortly after for MRI review and treatment discussion.     Procedure Note  Patient: Mitchell Boyer             Date of Birth: 09-Jun-1995           MRN: [id number]             Visit Date: 04/11/2024  Procedures: Visit Diagnoses:  1. Chronic right shoulder pain     Large Joint Inj: R subacromial bursa on 04/11/2024 5:47 PM Indications: pain Details: 22 G 1.5 in needle, posterior approach Medications: 4 mL lidocaine  1 %; 2 mL triamcinolone acetonide 40 MG/ML Outcome: tolerated well, no immediate complications Procedure, treatment alternatives, risks and benefits explained, specific risks discussed. Consent was given by the patient. Immediately prior to procedure a time out was called to verify the correct patient, procedure, equipment, support staff and site/side marked as required. Patient was prepped and draped in the usual sterile fashion.       I personally saw and evaluated the patient, and  participated in the management and treatment plan.  Leonce Reveal, PA-C Orthopedics

## 2024-05-05 ENCOUNTER — Other Ambulatory Visit

## 2024-05-10 ENCOUNTER — Ambulatory Visit (HOSPITAL_BASED_OUTPATIENT_CLINIC_OR_DEPARTMENT_OTHER): Admitting: Orthopaedic Surgery

## 2024-05-31 ENCOUNTER — Ambulatory Visit
Admission: RE | Admit: 2024-05-31 | Discharge: 2024-05-31 | Disposition: A | Discharge status: Home / Self Care | Source: Ambulatory Visit | Attending: Student | Admitting: Student

## 2024-05-31 DIAGNOSIS — G8929 Other chronic pain: Secondary | ICD-10-CM

## 2024-05-31 MED ORDER — GADOBENATE DIMEGLUMINE 529 MG/ML IV SOLN
0.1000 mL | Freq: Once | INTRAVENOUS | Status: AC | PRN
  Administered 2024-05-31: 0.1 mL via INTRA_ARTICULAR

## 2024-05-31 MED ORDER — IOPAMIDOL (ISOVUE-M 200) INJECTION 41%
10.0000 mL | Freq: Once | INTRAMUSCULAR | Status: AC
  Administered 2024-05-31: 10 mL via INTRA_ARTICULAR

## 2024-06-15 ENCOUNTER — Other Ambulatory Visit (HOSPITAL_BASED_OUTPATIENT_CLINIC_OR_DEPARTMENT_OTHER): Payer: Self-pay

## 2024-06-15 ENCOUNTER — Ambulatory Visit (HOSPITAL_BASED_OUTPATIENT_CLINIC_OR_DEPARTMENT_OTHER): Admitting: Orthopaedic Surgery

## 2024-06-15 ENCOUNTER — Ambulatory Visit (HOSPITAL_BASED_OUTPATIENT_CLINIC_OR_DEPARTMENT_OTHER): Payer: Self-pay | Admitting: Orthopaedic Surgery

## 2024-06-15 DIAGNOSIS — S43431A Superior glenoid labrum lesion of right shoulder, initial encounter: Secondary | ICD-10-CM | POA: Diagnosis not present

## 2024-06-15 MED ORDER — IBUPROFEN 800 MG PO TABS
800.0000 mg | ORAL_TABLET | Freq: Three times a day (TID) | ORAL | 0 refills | Status: AC
  Filled 2024-06-15: qty 30, 10d supply, fill #0

## 2024-06-15 MED ORDER — ACETAMINOPHEN 500 MG PO TABS
500.0000 mg | ORAL_TABLET | Freq: Three times a day (TID) | ORAL | 0 refills | Status: AC
  Filled 2024-06-15: qty 30, 10d supply, fill #0

## 2024-06-15 MED ORDER — OXYCODONE HCL 5 MG PO TABS
5.0000 mg | ORAL_TABLET | ORAL | 0 refills | Status: AC | PRN
  Filled 2024-06-15: qty 10, 2d supply, fill #0

## 2024-06-15 MED ORDER — ASPIRIN 325 MG PO TBEC
325.0000 mg | DELAYED_RELEASE_TABLET | Freq: Every day | ORAL | 0 refills | Status: AC
  Filled 2024-06-15: qty 14, 14d supply, fill #0

## 2024-06-15 NOTE — Progress Notes (Signed)
 "                   Chief Complaint: Right shoulder pain      06/15/2024: Presents today for MRI discussion and follow-up of the right shoulder.  He is still having persistent pain and clunking.    History of Present Illness Mitchell Boyer is a 29 year old male who presents with right shoulder pain.   He reports history of a labrum tear in his right shoulder due to an injury while playing baseball in high school approximately 15 years ago.  This was confirmed via MRI and he underwent physical therapy at that time, and still continues to perform some of these exercises and stretches. He experiences a sensation of the shoulder 'going to fall out' when sleeping on the right side, with pain upon waking. The pain is deep within the shoulder, radiating to the chest, causing tightness. A 'clunking' sensation occurs with shoulder movement. Physical activities such as push-ups and playing pickleball and baseball with his children exacerbate the shoulder issues. The pain is more noticeable when sleeping on the right side and when sitting with poor posture at his desk. The shoulder pain impacts comfort and daily activities, sometimes causing chest discomfort described as tightness at the top of the chest. 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     Surgical History:   None   PMH/PSH/Family History/Social History/Meds/Allergies:   No past medical history on file.     No past surgical history on file.     Social History         Socioeconomic History   Marital status: Married      Spouse name: Not on file   Number of children: Not on file   Years of education: Not on file   Highest education level: Not on file  Occupational History   Not on file  Tobacco Use   Smoking status: Some Days      Types: Cigars   Smokeless tobacco: Never  Vaping Use   Vaping status: Never Used  Substance and Sexual Activity   Alcohol use: Yes   Drug use: No    Sexual activity: Not on file  Other Topics Concern   Not on file  Social History Narrative   Not on file    Social Drivers of Health        Financial Resource Strain: Not on file  Food Insecurity: No Food Insecurity (09/27/2020)    [facility]    Hunger Vital Sign     Within the past 12 months, you worried that your food would run out before you got the money to buy more.: Never true     Within the past 12 months, the food you bought just didn't last and you didn't have money to get more.: Never true  Transportation Needs: Not on file  Physical Activity: Not on file  Stress: Not on file  Social Connections: Not on file         Family History  Problem Relation Age of Onset   Hypertension Mother     Hyperlipidemia Mother          Allergies      Allergies  Allergen Reactions   Shellfish Allergy Itching            Current Outpatient Medications  Medication Sig Dispense Refill   losartan  (COZAAR ) 25 MG tablet Take 1 tablet (  25 mg total) by mouth daily. 30 tablet 5   meclizine  (ANTIVERT ) 50 MG tablet Take 1 tablet (50 mg total) by mouth 3 (three) times daily as needed. (Patient not taking: Reported on 10/01/2023) 30 tablet 0      No current facility-administered medications for this visit.      Imaging Results (Last 48 hours)  No results found.     Review of Systems:   A ROS was performed including pertinent positives and negatives as documented in the HPI.   Physical Exam :   Constitutional: NAD and appears stated age Neurological: Alert and oriented Psych: Appropriate affect and cooperative There were no vitals taken for this visit.    Comprehensive Musculoskeletal Exam:     Exam of the right shoulder demonstrates active range of motion to 160 degrees forward flexion, 80 degrees external rotation, and internal rotation to L5.  Tenderness over the anterior glenohumeral joint.  Positive Vonzell and O'Brien.  Pain without weakness with empty can.   Decreased internal range of motion with lift off test compared to contralateral side.   Imaging:   Xray (right shoulder 3 views): Negative for bony abnormality  MRI right shoulder: Anterior inferior as well as posterior inferior labral tear   I personally reviewed and interpreted the radiographs.          Assessment & Plan Right shoulder pain with history of labral tear   Patient continues to experience chronic right shoulder pain dating back to a high school baseball injury sustained approximately 15 years ago.  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  He has now had 1 injection without definitive relief.  He has tried physical therapy multiple times nearly yearly for the last several years.  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  At this time he is continue to have instability symptoms and given this I did discuss the possibility of inferior labral repair.  I did discuss risks and limitations as well as associated recovery timeframe.  After discussion he would like to proceed  - Plan for right shoulder arthroscopy with inferior labral repair   After a lengthy discussion of treatment options, including risks, benefits, alternatives, complications of surgical and nonsurgical conservative options, the patient elected surgical repair.   The patient  is aware of the material risks  and complications including, but not limited to injury to adjacent structures, neurovascular injury, infection, numbness, bleeding, implant failure, thermal burns, stiffness, persistent pain, failure to heal, disease transmission from allograft, need for further surgery, dislocation, anesthetic risks, blood clots, risks of death,and others. The probabilities of surgical success and failure discussed with patient given their particular co-morbidities.The time and nature of expected  rehabilitation and recovery was discussed.The patient's questions were all answered preoperatively.  No barriers to understanding were noted. I explained the natural history of the disease process and Rx rationale.  I explained to the patient what I considered to be reasonable expectations given their personal situation.  The final treatment plan was arrived at through a shared patient decision making process model.  "

## 2024-06-26 ENCOUNTER — Other Ambulatory Visit (HOSPITAL_BASED_OUTPATIENT_CLINIC_OR_DEPARTMENT_OTHER): Payer: Self-pay
